# Patient Record
Sex: Male | Born: 1960 | Hispanic: No | Marital: Married | Smoking: Never smoker
Health system: Southern US, Community
[De-identification: ages and names within clinical notes are randomized; demographics above are authoritative.]

## PROBLEM LIST (undated history)

## (undated) DIAGNOSIS — I10 Essential (primary) hypertension: Secondary | ICD-10-CM

## (undated) DIAGNOSIS — M1990 Unspecified osteoarthritis, unspecified site: Secondary | ICD-10-CM

## (undated) HISTORY — PX: BACK SURGERY: SHX140

---

## 2015-10-08 ENCOUNTER — Emergency Department (HOSPITAL_COMMUNITY): Payer: BLUE CROSS/BLUE SHIELD

## 2015-10-08 ENCOUNTER — Emergency Department (HOSPITAL_COMMUNITY)
Admission: EM | Admit: 2015-10-08 | Discharge: 2015-10-08 | Disposition: A | Discharge status: Home / Self Care | Payer: BLUE CROSS/BLUE SHIELD | Attending: Emergency Medicine | Admitting: Emergency Medicine

## 2015-10-08 ENCOUNTER — Encounter (HOSPITAL_COMMUNITY): Payer: Self-pay | Admitting: Emergency Medicine

## 2015-10-08 DIAGNOSIS — I10 Essential (primary) hypertension: Secondary | ICD-10-CM | POA: Insufficient documentation

## 2015-10-08 DIAGNOSIS — M545 Low back pain, unspecified: Secondary | ICD-10-CM

## 2015-10-08 DIAGNOSIS — Z9889 Other specified postprocedural states: Secondary | ICD-10-CM | POA: Insufficient documentation

## 2015-10-08 DIAGNOSIS — Y9329 Activity, other involving ice and snow: Secondary | ICD-10-CM | POA: Diagnosis not present

## 2015-10-08 DIAGNOSIS — Z8739 Personal history of other diseases of the musculoskeletal system and connective tissue: Secondary | ICD-10-CM | POA: Diagnosis not present

## 2015-10-08 DIAGNOSIS — Y9289 Other specified places as the place of occurrence of the external cause: Secondary | ICD-10-CM | POA: Diagnosis not present

## 2015-10-08 DIAGNOSIS — Y998 Other external cause status: Secondary | ICD-10-CM | POA: Insufficient documentation

## 2015-10-08 DIAGNOSIS — S3992XA Unspecified injury of lower back, initial encounter: Secondary | ICD-10-CM | POA: Insufficient documentation

## 2015-10-08 HISTORY — DX: Essential (primary) hypertension: I10

## 2015-10-08 HISTORY — DX: Unspecified osteoarthritis, unspecified site: M19.90

## 2015-10-08 MED ORDER — KETOROLAC TROMETHAMINE 30 MG/ML IJ SOLN
30.0000 mg | Freq: Once | INTRAMUSCULAR | Status: AC
Start: 2015-10-08 — End: 2015-10-08
  Administered 2015-10-08: 30 mg via INTRAMUSCULAR
  Filled 2015-10-08: qty 1

## 2015-10-08 MED ORDER — NAPROXEN 250 MG PO TABS: 250.0000 mg | ORAL_TABLET | Freq: Two times a day (BID) | ORAL | Status: AC

## 2015-10-08 MED ORDER — METHOCARBAMOL 500 MG PO TABS: 500.0000 mg | ORAL_TABLET | Freq: Two times a day (BID) | ORAL | Status: AC | PRN
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
## 2015-10-08 NOTE — Discharge Instructions (Signed)
Back Exercises °The following exercises strengthen the muscles that help to support the back. They also help to keep the lower back flexible. Doing these exercises can help to prevent back pain or lessen existing pain. °If you have back pain or discomfort, try doing these exercises 2-3 times each day or as told by your health care provider. When the pain goes away, do them once each day, but increase the number of times that you repeat the steps for each exercise (do more repetitions). If you do not have back pain or discomfort, do these exercises once each day or as told by your health care provider. °EXERCISES °Single Knee to Chest °Repeat these steps 3-5 times for each leg: °· Lie on your back on a firm bed or the floor with your legs extended. °· Bring one knee to your chest. Your other leg should stay extended and in contact with the floor. °· Hold your knee in place by grabbing your knee or thigh. °· Pull on your knee until you feel a gentle stretch in your lower back. °· Hold the stretch for 10-30 seconds. °· Slowly release and straighten your leg. °Pelvic Tilt °Repeat these steps 5-10 times: °· Lie on your back on a firm bed or the floor with your legs extended. °· Bend your knees so they are pointing toward the ceiling and your feet are flat on the floor. °· Tighten your lower abdominal muscles to press your lower back against the floor. This motion will tilt your pelvis so your tailbone points up toward the ceiling instead of pointing to your feet or the floor. °· With gentle tension and even breathing, hold this position for 5-10 seconds. °Cat-Cow °Repeat these steps until your lower back becomes more flexible: °· Get into a hands-and-knees position on a firm surface. Keep your hands under your shoulders, and keep your knees under your hips. You may place padding under your knees for comfort. °· Let your head hang down, and point your tailbone toward the floor so your lower back becomes rounded like the  back of a cat. °· Hold this position for 5 seconds. °· Slowly lift your head and point your tailbone up toward the ceiling so your back forms a sagging arch like the back of a cow. °· Hold this position for 5 seconds. °Press-Ups °Repeat these steps 5-10 times: °· Lie on your abdomen (face-down) on the floor. °· Place your palms near your head, about shoulder-width apart. °· While you keep your back as relaxed as possible and keep your hips on the floor, slowly straighten your arms to raise the top half of your body and lift your shoulders. Do not use your back muscles to raise your upper torso. You may adjust the placement of your hands to make yourself more comfortable. °· Hold this position for 5 seconds while you keep your back relaxed. °· Slowly return to lying flat on the floor. °Bridges °Repeat these steps 10 times: °1. Lie on your back on a firm surface. °2. Bend your knees so they are pointing toward the ceiling and your feet are flat on the floor. °3. Tighten your buttocks muscles and lift your buttocks off of the floor until your waist is at almost the same height as your knees. You should feel the muscles working in your buttocks and the back of your thighs. If you do not feel these muscles, slide your feet 1-2 inches farther away from your buttocks. °4. Hold this position for 3-5   seconds. °5. Slowly lower your hips to the starting position, and allow your buttocks muscles to relax completely. °If this exercise is too easy, try doing it with your arms crossed over your chest. °Abdominal Crunches °Repeat these steps 5-10 times: °1. Lie on your back on a firm bed or the floor with your legs extended. °2. Bend your knees so they are pointing toward the ceiling and your feet are flat on the floor. °3. Cross your arms over your chest. °4. Tip your chin slightly toward your chest without bending your neck. °5. Tighten your abdominal muscles and slowly raise your trunk (torso) high enough to lift your shoulder  blades a tiny bit off of the floor. Avoid raising your torso higher than that, because it can put too much stress on your low back and it does not help to strengthen your abdominal muscles. °6. Slowly return to your starting position. °Back Lifts °Repeat these steps 5-10 times: °1. Lie on your abdomen (face-down) with your arms at your sides, and rest your forehead on the floor. °2. Tighten the muscles in your legs and your buttocks. °3. Slowly lift your chest off of the floor while you keep your hips pressed to the floor. Keep the back of your head in line with the curve in your back. Your eyes should be looking at the floor. °4. Hold this position for 3-5 seconds. °5. Slowly return to your starting position. °SEEK MEDICAL CARE IF: °· Your back pain or discomfort gets much worse when you do an exercise. °· Your back pain or discomfort does not lessen within 2 hours after you exercise. °If you have any of these problems, stop doing these exercises right away. Do not do them again unless your health care provider says that you can. °SEEK IMMEDIATE MEDICAL CARE IF: °· You develop sudden, severe back pain. If this happens, stop doing the exercises right away. Do not do them again unless your health care provider says that you can. °  °This information is not intended to replace advice given to you by your health care provider. Make sure you discuss any questions you have with your health care provider. °  °Document Released: 10/15/2004 Document Revised: 05/29/2015 Document Reviewed: 11/01/2014 °[employer]vier Interactive Patient Education ©2016 [employer] ° °Back Pain, Adult °Back pain is very common in adults. The cause of back pain is rarely dangerous and the pain often gets better over time. The cause of your back pain may not be known. Some common causes of back pain include: °· Strain of the muscles or ligaments supporting the spine. °· Wear and tear (degeneration) of the spinal disks. °· Arthritis. °· Direct injury  to the back. °For many people, back pain may return. Since back pain is rarely dangerous, most people can learn to manage this condition on their own. °HOME CARE INSTRUCTIONS °Watch your back pain for any changes. The following actions may help to lessen any discomfort you are feeling: °· Remain active. It is stressful on your back to sit or stand in one place for long periods of time. Do not sit, drive, or stand in one place for more than 30 minutes at a time. Take short walks on even surfaces as soon as you are able. Try to increase the length of time you walk each day. °· Exercise regularly as directed by your health care provider. Exercise helps your back heal faster. It also helps avoid future injury by keeping your muscles strong and flexible. °· Do not stay in   bed. Resting more than 1-2 days can delay your recovery. °· Pay attention to your body when you bend and lift. The most comfortable positions are those that put less stress on your recovering back. Always use proper lifting techniques, including: °¨ Bending your knees. °¨ Keeping the load close to your body. °¨ Avoiding twisting. °· Find a comfortable position to sleep. Use a firm mattress and lie on your side with your knees slightly bent. If you lie on your back, put a pillow under your knees. °· Avoid feeling anxious or stressed. Stress increases muscle tension and can worsen back pain. It is important to recognize when you are anxious or stressed and learn ways to manage it, such as with exercise. °· Take medicines only as directed by your health care provider. Over-the-counter medicines to reduce pain and inflammation are often the most helpful. Your health care provider may prescribe muscle relaxant drugs. These medicines help dull your pain so you can more quickly return to your normal activities and healthy exercise. °· Apply ice to the injured area: °¨ Put ice in a plastic bag. °¨ Place a towel between your skin and the bag. °¨ Leave the ice on  for 20 minutes, 2-3 times a day for the first 2-3 days. After that, ice and heat may be alternated to reduce pain and spasms. °· Maintain a healthy weight. Excess weight puts extra stress on your back and makes it difficult to maintain good posture. °SEEK MEDICAL CARE IF: °· You have pain that is not relieved with rest or medicine. °· You have increasing pain going down into the legs or buttocks. °· You have pain that does not improve in one week. °· You have night pain. °· You lose weight. °· You have a fever or chills. °SEEK IMMEDIATE MEDICAL CARE IF:  °· You develop new bowel or bladder control problems. °· You have unusual weakness or numbness in your arms or legs. °· You develop nausea or vomiting. °· You develop abdominal pain. °· You feel faint. °  °This information is not intended to replace advice given to you by your health care provider. Make sure you discuss any questions you have with your health care provider. °  °Document Released: 09/07/2005 Document Revised: 09/28/2014 Document Reviewed: 01/09/2014 °Elsevier Interactive Patient Education ©2016 [employer] ° °
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
